# Patient Record
Sex: Female | Born: 1995 | Race: Black or African American | Hispanic: No | Marital: Single | Smoking: Never smoker
Health system: Southern US, Community
[De-identification: ages and names within clinical notes are randomized; demographics above are authoritative.]

## PROBLEM LIST (undated history)

## (undated) DIAGNOSIS — J45909 Unspecified asthma, uncomplicated: Secondary | ICD-10-CM

---
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
## 2015-09-18 IMAGING — CR DG CHEST 2V
2 series · 2 of 2 positions shown · non-contrast
Comparison: None.

CLINICAL DATA: Cough and shortness of breath.  Recent bronchitis.

EXAM:
CHEST  2 VIEW

[w chest pa]
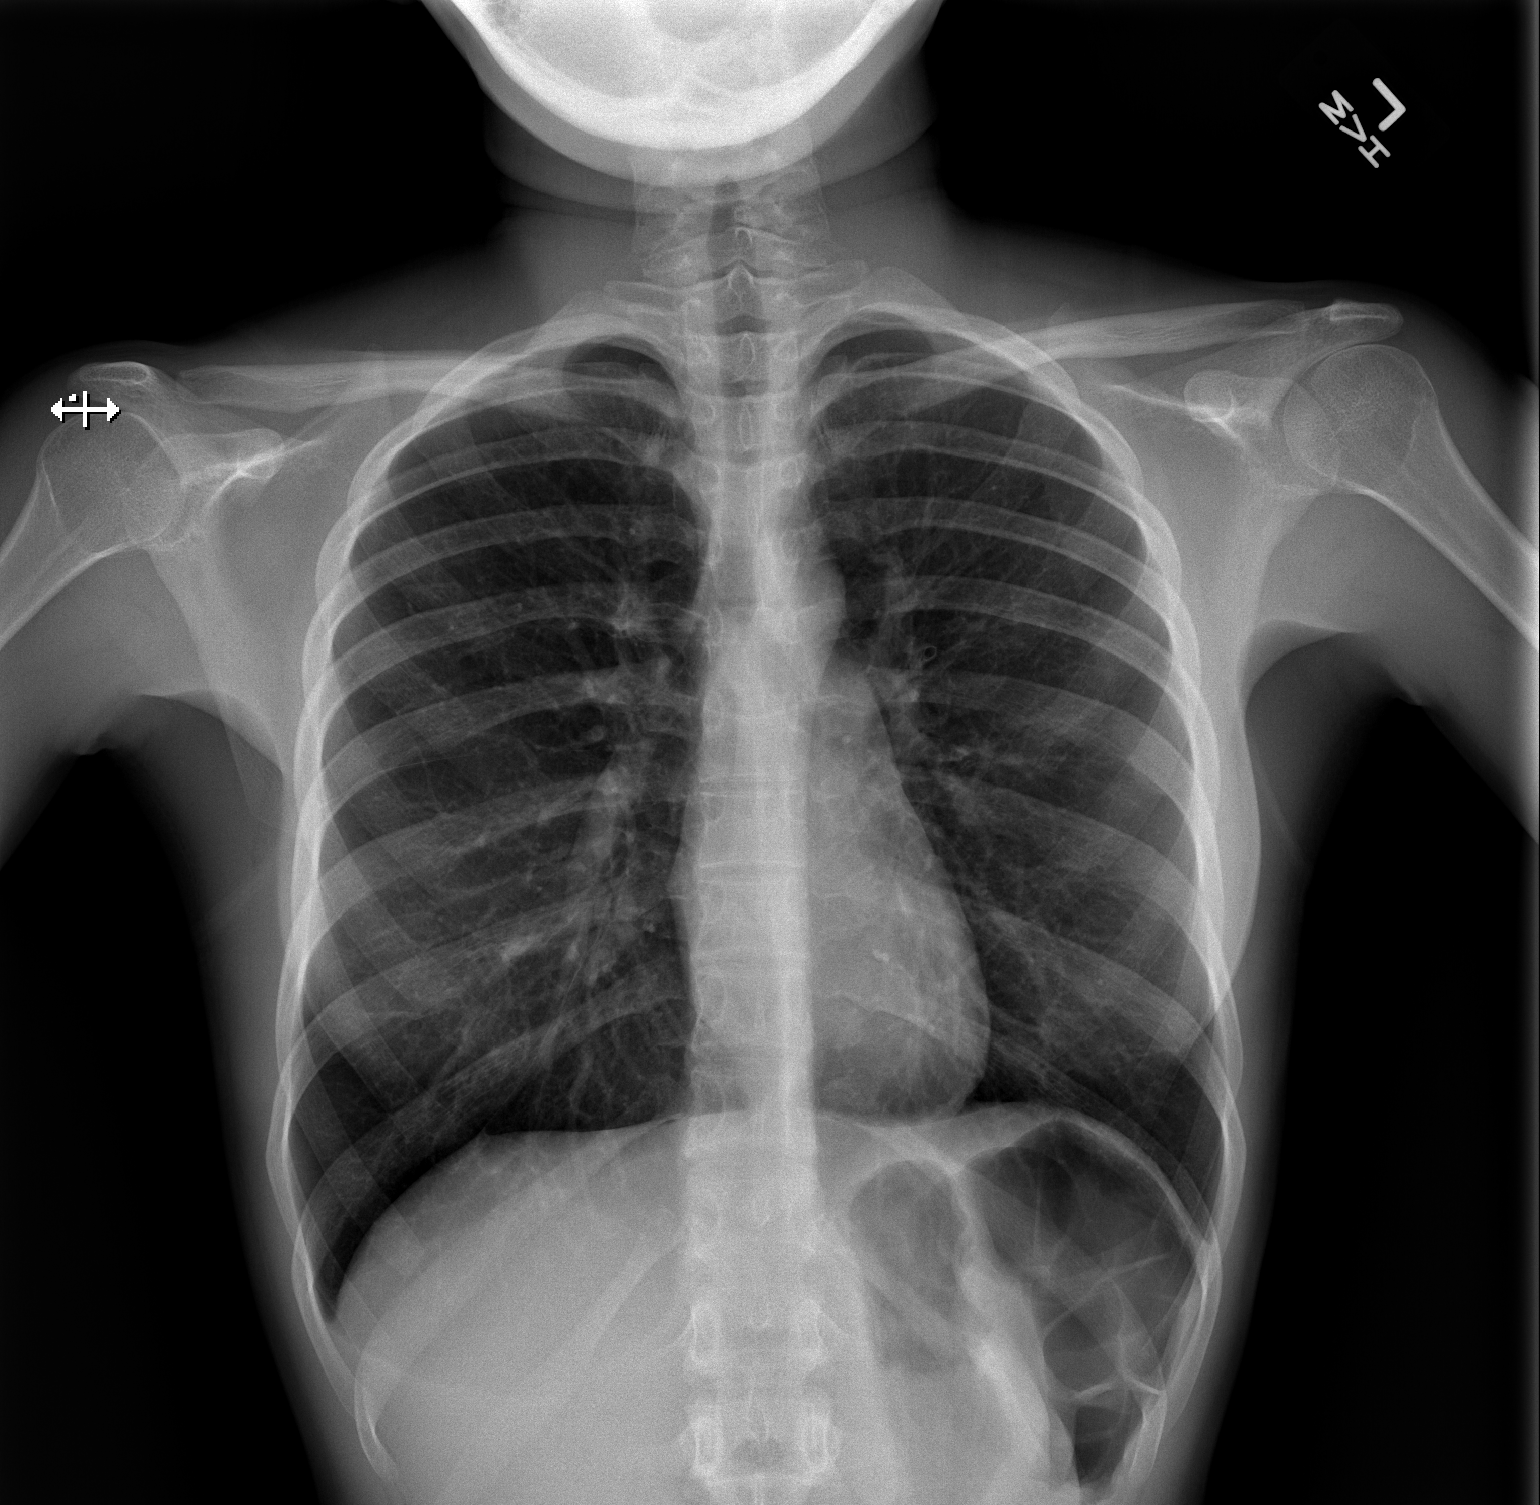

[w chest lat]
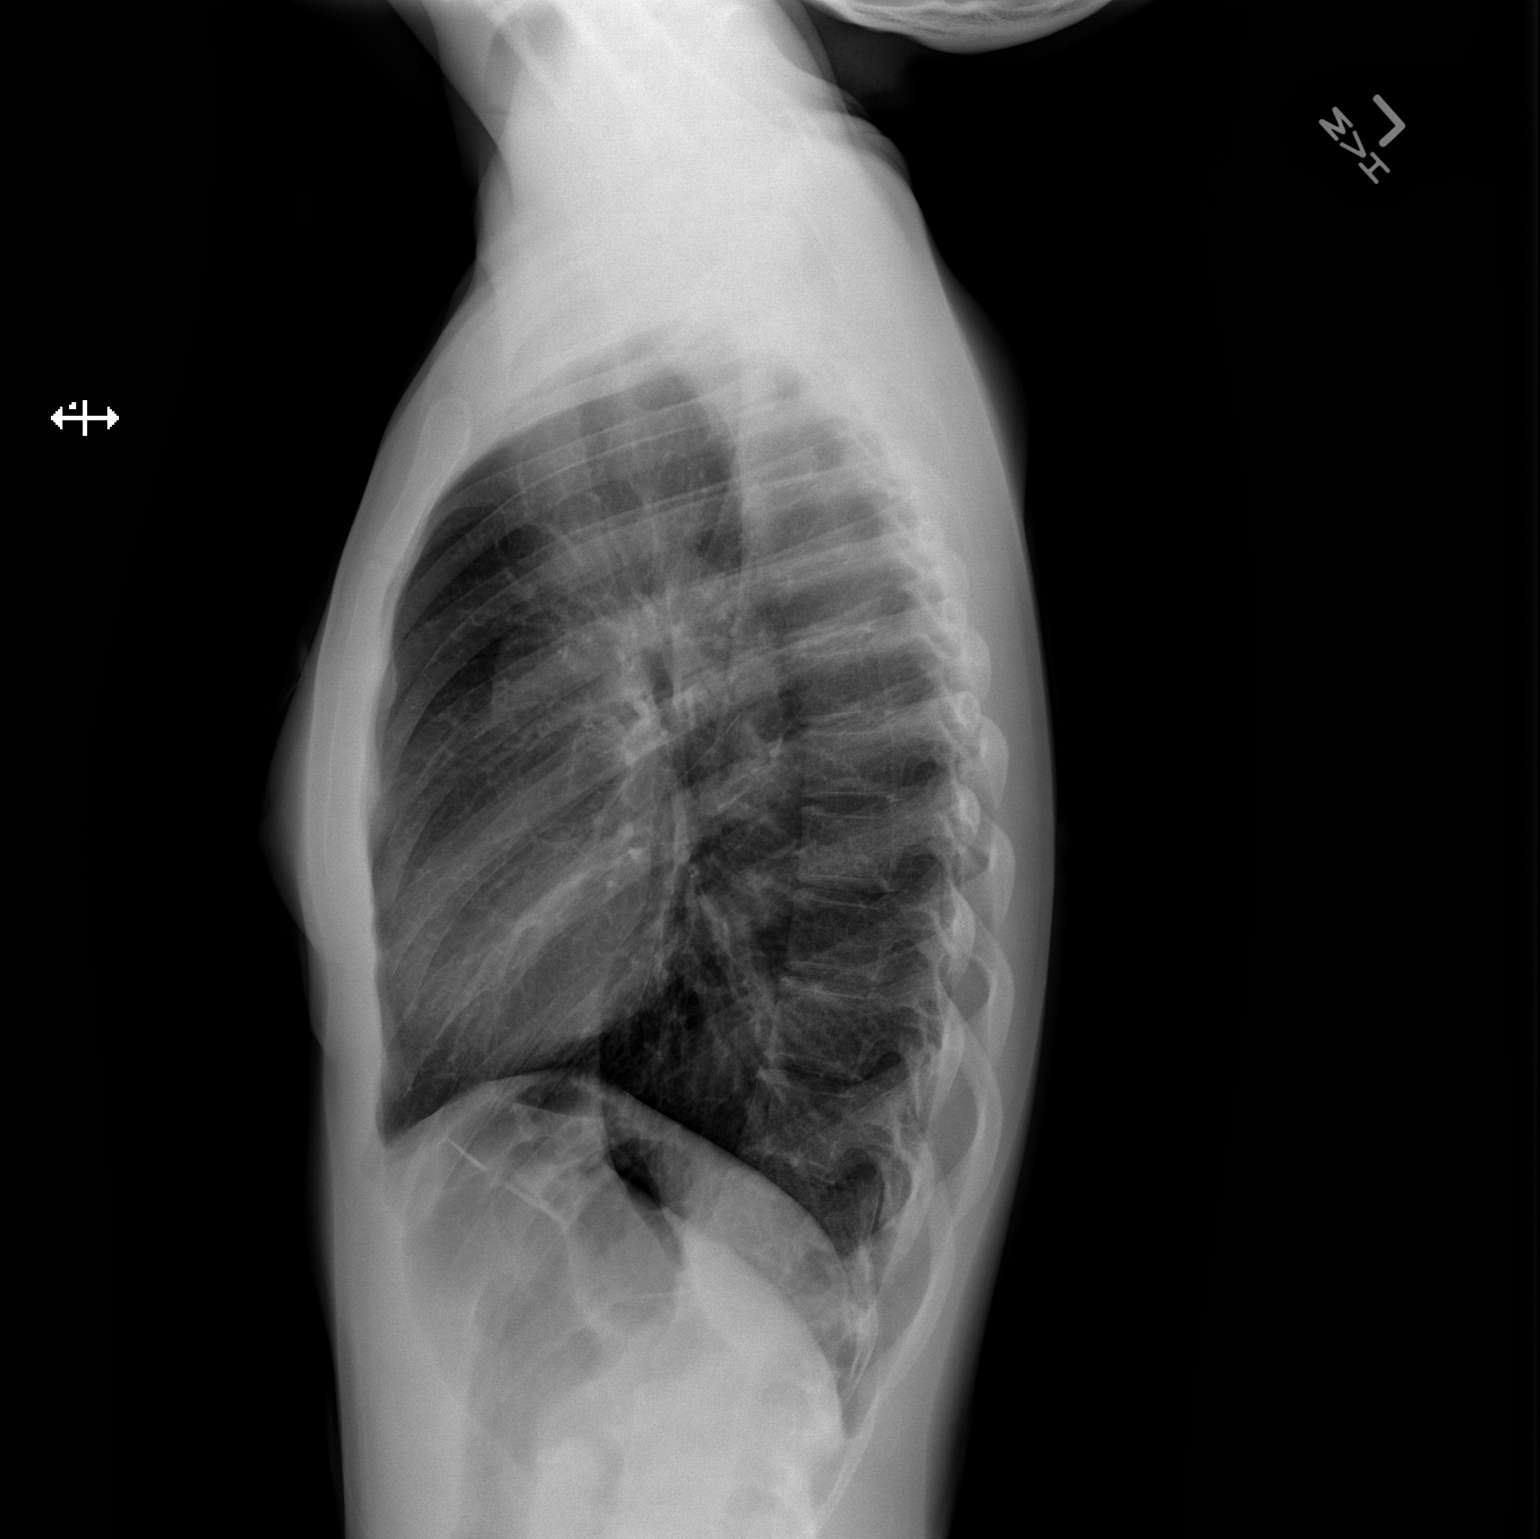

[2 of 2 positions shown; findings below may reference images not displayed]

FINDINGS: The heart size and mediastinal contours are within normal limits.
The lungs are hyperinflated but clear. Mild peribronchial cuffing
noted. No airspace consolidation The visualized skeletal structures
are unremarkable.
IMPRESSION: 1. Hyperinflation.
2. No pneumonia
